# Patient Record
Sex: Female | Born: 2005 | Race: Black or African American | Hispanic: No | Marital: Single
Health system: Southern US, Community
[De-identification: ages and names within clinical notes are randomized; demographics above are authoritative.]

## PROBLEM LIST (undated history)

## (undated) DIAGNOSIS — J45909 Unspecified asthma, uncomplicated: Secondary | ICD-10-CM

## (undated) DIAGNOSIS — F32A Depression, unspecified: Secondary | ICD-10-CM

## (undated) HISTORY — PX: ADENOIDECTOMY W/ MYRINGOTOMY: SHX1128

## (undated) HISTORY — PX: TONSILLECTOMY: SUR1361

---
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
## 2023-01-16 ENCOUNTER — Telehealth (HOSPITAL_COMMUNITY): Payer: Self-pay | Admitting: Psychiatry

## 2023-01-16 ENCOUNTER — Ambulatory Visit (HOSPITAL_COMMUNITY)
Admission: EM | Admit: 2023-01-16 | Discharge: 2023-01-16 | Disposition: A | Discharge status: Home / Self Care | Payer: BC Managed Care – PPO | Attending: Registered Nurse | Admitting: Registered Nurse

## 2023-01-16 ENCOUNTER — Encounter (HOSPITAL_COMMUNITY): Payer: Self-pay | Admitting: Registered Nurse

## 2023-01-16 DIAGNOSIS — F332 Major depressive disorder, recurrent severe without psychotic features: Secondary | ICD-10-CM

## 2023-01-16 DIAGNOSIS — F1210 Cannabis abuse, uncomplicated: Secondary | ICD-10-CM | POA: Diagnosis not present
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
## 2023-01-16 NOTE — Telephone Encounter (Signed)
D:  Mariah Crawford, [name], [facility] referred pt to MH-IOP.  A:  Placed call to orient pt, but there was no answer.  Case manager left number and requested pt to give her a call back.  Inform Mariah and Ava.

## 2023-01-16 NOTE — Discharge Summary (Signed)
Mariah Crawford to be D/C'd Home per NP order. An After Visit Summary was printed and given to the patient by provider. Patient escorted out and D/C home via private auto.  [name]  01/16/2023 5:02 PM

## 2023-03-20 ENCOUNTER — Emergency Department (HOSPITAL_COMMUNITY): Payer: BC Managed Care – PPO

## 2023-03-20 ENCOUNTER — Other Ambulatory Visit: Payer: Self-pay

## 2023-03-20 ENCOUNTER — Emergency Department (HOSPITAL_COMMUNITY)
Admission: EM | Admit: 2023-03-20 | Discharge: 2023-03-20 | Disposition: A | Discharge status: Home / Self Care | Payer: BC Managed Care – PPO | Attending: Emergency Medicine | Admitting: Emergency Medicine

## 2023-03-20 DIAGNOSIS — D649 Anemia, unspecified: Secondary | ICD-10-CM | POA: Insufficient documentation

## 2023-03-20 DIAGNOSIS — M545 Low back pain, unspecified: Secondary | ICD-10-CM | POA: Diagnosis not present

## 2023-03-20 DIAGNOSIS — J45909 Unspecified asthma, uncomplicated: Secondary | ICD-10-CM | POA: Diagnosis not present

## 2023-03-20 DIAGNOSIS — B9689 Other specified bacterial agents as the cause of diseases classified elsewhere: Secondary | ICD-10-CM

## 2023-03-20 DIAGNOSIS — N760 Acute vaginitis: Secondary | ICD-10-CM | POA: Insufficient documentation

## 2023-03-20 DIAGNOSIS — N939 Abnormal uterine and vaginal bleeding, unspecified: Secondary | ICD-10-CM | POA: Diagnosis present

## 2023-03-20 LAB — COMPREHENSIVE METABOLIC PANEL
ALT: 13 U/L (ref 0–44)
AST: 25 U/L (ref 15–41)
Albumin: 3.7 g/dL (ref 3.5–5.0)
Alkaline Phosphatase: 66 U/L (ref 38–126)
Anion gap: 16 — ABNORMAL HIGH (ref 5–15)
BUN: 10 mg/dL (ref 6–20)
CO2: 21 mmol/L — ABNORMAL LOW (ref 22–32)
Calcium: 9.3 mg/dL (ref 8.9–10.3)
Chloride: 101 mmol/L (ref 98–111)
Creatinine, Ser: 0.88 mg/dL (ref 0.44–1.00)
GFR, Estimated: >60 mL/min (ref >60)
Glucose, Bld: 90 mg/dL (ref 70–99)
Potassium: 3.7 mmol/L (ref 3.5–5.1)
Sodium: 138 mmol/L (ref 135–145)
Total Bilirubin: 0.7 mg/dL (ref 0.3–1.2)
Total Protein: 7.3 g/dL (ref 6.5–8.1)

## 2023-03-20 LAB — CBC WITH DIFFERENTIAL/PLATELET
Abs Immature Granulocytes: 0.05 10*3/uL (ref 0.00–0.07)
Basophils Absolute: 0 10*3/uL (ref 0.0–0.1)
Basophils Relative: 0 %
Eosinophils Absolute: 0.1 10*3/uL (ref 0.0–0.5)
Eosinophils Relative: 1 %
HCT: 37 % (ref 36.0–46.0)
Hemoglobin: 11.8 g/dL — ABNORMAL LOW (ref 12.0–15.0)
Immature Granulocytes: 1 %
Lymphocytes Relative: 29 %
Lymphs Abs: 2.7 10*3/uL (ref 0.7–4.0)
MCH: 27.3 pg (ref 26.0–34.0)
MCHC: 31.9 g/dL (ref 30.0–36.0)
MCV: 85.6 fL (ref 80.0–100.0)
Monocytes Absolute: 0.6 10*3/uL (ref 0.1–1.0)
Monocytes Relative: 6 %
Neutro Abs: 5.8 10*3/uL (ref 1.7–7.7)
Neutrophils Relative %: 63 %
Platelets: 272 10*3/uL (ref 150–400)
RBC: 4.32 MIL/uL (ref 3.87–5.11)
RDW: 13 % (ref 11.5–15.5)
WBC: 9.2 10*3/uL (ref 4.0–10.5)
nRBC: 0 % (ref 0.0–0.2)

## 2023-03-20 LAB — URINALYSIS, ROUTINE W REFLEX MICROSCOPIC
Bilirubin Urine: NEGATIVE
Glucose, UA: NEGATIVE mg/dL
Ketones, ur: NEGATIVE mg/dL
Nitrite: NEGATIVE
Protein, ur: NEGATIVE mg/dL
Specific Gravity, Urine: 1.015 (ref 1.005–1.030)
pH: 8 (ref 5.0–8.0)

## 2023-03-20 LAB — WET PREP, GENITAL
Sperm: NONE SEEN
Trich, Wet Prep: NONE SEEN
WBC, Wet Prep HPF POC: <10 (ref <10)
Yeast Wet Prep HPF POC: NONE SEEN

## 2023-03-20 LAB — I-STAT BETA HCG BLOOD, ED (MC, WL, AP ONLY): I-stat hCG, quantitative: <5 m[IU]/mL (ref <5)

## 2023-03-20 LAB — URINALYSIS, MICROSCOPIC (REFLEX): WBC, UA: >50 WBC/hpf (ref 0–5)

## 2023-03-20 LAB — LIPASE, BLOOD: Lipase: 36 U/L (ref 11–51)

## 2023-03-20 MED ORDER — METRONIDAZOLE 500 MG PO TABS: 500.0000 mg | ORAL_TABLET | Freq: Two times a day (BID) | ORAL | 0 refills | Status: DC

## 2023-03-20 MED ORDER — METRONIDAZOLE 500 MG PO TABS
500.0000 mg | ORAL_TABLET | Freq: Once | ORAL | Status: AC
  Administered 2023-03-20: 500 mg via ORAL
  Filled 2023-03-20: qty 1

## 2023-03-20 NOTE — ED Provider Notes (Signed)
[place] EMERGENCY DEPARTMENT AT [facility] Provider Note   CSN: [id number] Arrival date & time: 03/20/23  4098     History  Chief Complaint  Patient presents with   Vaginal Bleeding    Mariah Crawford is a 17 y.o. female with a past medical history significant for asthma who presents to the ED due to intermittent vaginal bleeding x 1 week associated with "2 balls" in her vagina.  Patient also endorses low back pain.  Symptoms started after a heavy menstrual cycle 1 week ago.  She noticed small balls in her vagina a few days ago.  Did not use tampons during last menstrual cycle.  Notes her menstrual cycle was heavier and longer than normal.  No history of heavy menses.  Also endorses increased vaginal discharge and malodor.  Patient is currently sexually active with 1 partner.  Denies concerns for STIs.  Denies any urinary symptoms.  Patient is followed by an OB/GYN with [facility]. Currently on oral BC. No concern for pregnancy.   History obtained from patient and past medical records. No interpreter used during encounter.       Home Medications Prior to Admission medications   Medication Sig Start Date End Date Taking? Authorizing Provider  metroNIDAZOLE (FLAGYL) 500 MG tablet Take 1 tablet (500 mg total) by mouth 2 (two) times daily. 03/20/23  Yes Sanvi Ehler, Merla Riches, PA-C  FLUoxetine (PROZAC) 10 MG capsule Take 3 capsules (30 mg total) by mouth daily. 11/28/21   Leata Mouse, MD  hydrOXYzine (ATARAX) 50 MG tablet Take 1 tablet (50 mg total) by mouth at bedtime. 11/27/21   Leata Mouse, MD  linaclotide Karlene Einstein) 145 MCG CAPS capsule Take 1 capsule (145 mcg total) by mouth daily before breakfast. 11/28/21   Leata Mouse, MD  nicotine (NICODERM CQ - DOSED IN MG/24 HR) 7 mg/24hr patch Place 1 patch (7 mg total) onto the skin daily. 11/28/21   Leata Mouse, MD  traZODone (DESYREL) 50 MG tablet Take 1 tablet (50 mg total) by  mouth at bedtime as needed for sleep. 11/27/21   Leata Mouse, MD      Allergies    Lactose intolerance (gi)    Review of Systems   Review of Systems  Constitutional:  Negative for chills and fever.  Respiratory:  Negative for shortness of breath.   Cardiovascular:  Negative for chest pain.  Gastrointestinal:  Negative for abdominal pain.  Genitourinary:  Positive for vaginal discharge. Negative for dysuria.  Musculoskeletal:  Positive for back pain.    Physical Exam Updated Vital Signs BP 112/79   Pulse 84   Temp 98.4 F (36.9 C) (Oral)   Resp 16   LMP 03/09/2023 (Exact Date)   SpO2 100%  Physical Exam Vitals and nursing note reviewed.  Constitutional:      General: She is not in acute distress.    Appearance: She is not ill-appearing.  HENT:     Head: Normocephalic.  Eyes:     Pupils: Pupils are equal, round, and reactive to light.  Cardiovascular:     Rate and Rhythm: Normal rate and regular rhythm.     Pulses: Normal pulses.     Heart sounds: Normal heart sounds. No murmur heard.    No friction rub. No gallop.  Pulmonary:     Effort: Pulmonary effort is normal.     Breath sounds: Normal breath sounds.  Abdominal:     General: Abdomen is flat. There is no distension.  Palpations: Abdomen is soft.     Tenderness: There is no abdominal tenderness. There is no guarding or rebound.  Musculoskeletal:        General: Normal range of motion.     Cervical back: Neck supple.  Skin:    General: Skin is warm and dry.  Neurological:     General: No focal deficit present.     Mental Status: She is alert.  Psychiatric:        Mood and Affect: Mood normal.        Behavior: Behavior normal.     ED Results / Procedures / Treatments   Labs (all labs ordered are listed, but only abnormal results are displayed) Labs Reviewed  WET PREP, GENITAL - Abnormal; Notable for the following components:      Result Value   Clue Cells Wet Prep HPF POC PRESENT (*)     All other components within normal limits  CBC WITH DIFFERENTIAL/PLATELET - Abnormal; Notable for the following components:   Hemoglobin 11.8 (*)    All other components within normal limits  COMPREHENSIVE METABOLIC PANEL - Abnormal; Notable for the following components:   CO2 21 (*)    Anion gap 16 (*)    All other components within normal limits  URINALYSIS, ROUTINE W REFLEX MICROSCOPIC - Abnormal; Notable for the following components:   APPearance CLOUDY (*)    Hgb urine dipstick SMALL (*)    Leukocytes,Ua LARGE (*)    All other components within normal limits  URINALYSIS, MICROSCOPIC (REFLEX) - Abnormal; Notable for the following components:   Bacteria, UA MANY (*)    All other components within normal limits  URINE CULTURE  LIPASE, BLOOD  I-STAT BETA HCG BLOOD, ED ([facility], [facility], [facility] ONLY)  GC/CHLAMYDIA PROBE AMP ([place]) NOT AT [facility]    EKG None  Radiology US Pelvis Complete  Result Date: 03/20/2023 CLINICAL DATA:  One-week history of vaginal bleeding and pelvic/lower back pain EXAM: TRANSABDOMINAL AND TRANSVAGINAL ULTRASOUND OF PELVIS DOPPLER ULTRASOUND OF OVARIES TECHNIQUE: Both transabdominal and transvaginal ultrasound examinations of the pelvis were performed. Transabdominal technique was performed for global imaging of the pelvis including uterus, ovaries, adnexal regions, and pelvic cul-de-sac. It was necessary to proceed with endovaginal exam following the transabdominal exam to visualize the pelvic structures. Color and duplex Doppler ultrasound was utilized to evaluate blood flow to the ovaries. COMPARISON:  Ultrasound pelvis dated 07/10/2017 FINDINGS: Uterus Measurements: 6.1 cm in sagittal dimension. No fibroids or other mass visualized. Endometrium Thickness: 1 mm.  No focal abnormality visualized. Right ovary Measurements: 3.5 x 2.1 x 1.3 cm = volume: 5.2 mL. Normal appearance. No adnexal mass. Left ovary Measurements: 3.0 x 2.1 x 2.0 cm = volume: 6.6 mL. Normal  appearance. No adnexal mass. Pulsed Doppler evaluation of both ovaries demonstrates normal low-resistance arterial and venous waveforms. Other findings No abnormal free fluid. IMPRESSION: Normal pelvic ultrasound examination. No evidence of ovarian torsion. Electronically Signed   By: Agustin Cree M.D.   On: 03/20/2023 10:41   US Transvaginal Non-OB  Result Date: 03/20/2023 CLINICAL DATA:  One-week history of vaginal bleeding and pelvic/lower back pain EXAM: TRANSABDOMINAL AND TRANSVAGINAL ULTRASOUND OF PELVIS DOPPLER ULTRASOUND OF OVARIES TECHNIQUE: Both transabdominal and transvaginal ultrasound examinations of the pelvis were performed. Transabdominal technique was performed for global imaging of the pelvis including uterus, ovaries, adnexal regions, and pelvic cul-de-sac. It was necessary to proceed with endovaginal exam following the transabdominal exam to visualize the pelvic structures. Color and duplex Doppler  ultrasound was utilized to evaluate blood flow to the ovaries. COMPARISON:  Ultrasound pelvis dated 07/10/2017 FINDINGS: Uterus Measurements: 6.1 cm in sagittal dimension. No fibroids or other mass visualized. Endometrium Thickness: 1 mm.  No focal abnormality visualized. Right ovary Measurements: 3.5 x 2.1 x 1.3 cm = volume: 5.2 mL. Normal appearance. No adnexal mass. Left ovary Measurements: 3.0 x 2.1 x 2.0 cm = volume: 6.6 mL. Normal appearance. No adnexal mass. Pulsed Doppler evaluation of both ovaries demonstrates normal low-resistance arterial and venous waveforms. Other findings No abnormal free fluid. IMPRESSION: Normal pelvic ultrasound examination. No evidence of ovarian torsion. Electronically Signed   By: Agustin Cree M.D.   On: 03/20/2023 10:41   [place] Art/Ven Flow Abd Pelv Doppler  Result Date: 03/20/2023 CLINICAL DATA:  One-week history of vaginal bleeding and pelvic/lower back pain EXAM: TRANSABDOMINAL AND TRANSVAGINAL ULTRASOUND OF PELVIS DOPPLER ULTRASOUND OF OVARIES TECHNIQUE: Both  transabdominal and transvaginal ultrasound examinations of the pelvis were performed. Transabdominal technique was performed for global imaging of the pelvis including uterus, ovaries, adnexal regions, and pelvic cul-de-sac. It was necessary to proceed with endovaginal exam following the transabdominal exam to visualize the pelvic structures. Color and duplex Doppler ultrasound was utilized to evaluate blood flow to the ovaries. COMPARISON:  Ultrasound pelvis dated 07/10/2017 FINDINGS: Uterus Measurements: 6.1 cm in sagittal dimension. No fibroids or other mass visualized. Endometrium Thickness: 1 mm.  No focal abnormality visualized. Right ovary Measurements: 3.5 x 2.1 x 1.3 cm = volume: 5.2 mL. Normal appearance. No adnexal mass. Left ovary Measurements: 3.0 x 2.1 x 2.0 cm = volume: 6.6 mL. Normal appearance. No adnexal mass. Pulsed Doppler evaluation of both ovaries demonstrates normal low-resistance arterial and venous waveforms. Other findings No abnormal free fluid. IMPRESSION: Normal pelvic ultrasound examination. No evidence of ovarian torsion. Electronically Signed   By: Agustin Cree M.D.   On: 03/20/2023 10:41    Procedures Procedures    Medications Ordered in ED Medications  metroNIDAZOLE (FLAGYL) tablet 500 mg (has no administration in time range)    ED Course/ Medical Decision Making/ A&P Clinical Course as of 03/20/23 1107  Wed Mar 20, 2023  1027 Clue Cells Wet Prep HPF POC(!): PRESENT [CA]  1027 I-stat hCG, quantitative: <5.0 [CA]    Clinical Course User Index [CA] Mannie Stabile, PA-C                             Medical Decision Making Amount and/or Complexity of Data Reviewed Independent Historian: parent Labs: ordered. Decision-making details documented in ED Course. Radiology: ordered and independent interpretation performed. Decision-making details documented in ED Course.  Risk Prescription drug management.   This patient presents to the ED for concern of  vaginal discharge and "balls in vagina", this involves an extensive number of treatment options, and is a complaint that carries with it a high risk of complications and morbidity.  The differential diagnosis includes BV, yeast, STI, fibroid, herpes, etc  17 year old female presents to the ED due to intermittent vaginal spotting x 1 week associated with "balls" in her vagina.  Patient had a heavy menstrual cycle 1 week ago and felt balls in her vagina since then.  Patient is currently sexually active with 1 partner.  No concern for STIs.  Admits to increased vaginal discharge and malodor.  No history of heavy menses.  Upon arrival, vitals all within normal limits.  Patient in no acute distress.  Reassuring physical exam.  Abdomen soft, nondistended, nontender. Low suspicion for acute abdomen. Pelvic exam performed with chaperone in room.  Did not appreciate any "balls" on exam.  Wet prep and gonorrhea/chlamydia cultures obtained.  No CMT.  No adnexal masses or tenderness.  Will obtain pelvic ultrasound to further investigate. Labs ordered.  CBC reassuring.  No leukocytosis.  Mild anemia with hemoglobin 11.8.  Lipase normal.  CMP reassuring.  Normal renal function.  No major electrolyte derangements.  Slight anion gap of unknown etiology.  UA significant for large leukocytes and many bacteria.  Urine does not appear contaminated.  Patient denies any dysuria or signs of UTI.  Will hold on antibiotic at this time.  Urine culture pending.  Wet prep positive for clue cells which is likely causing the increased vaginal discharge and malodor.  Pelvic ultrasound negative for any acute abnormalities.  No evidence of ovarian torsion.  No evidence of fibroids.  Unknown etiology of "balls" in vagina.  Advised patient to follow-up with her OB/GYN for further evaluation.  Patient discharged with Flagyl for BV.  Advised patient not to drink alcohol while on the medication. Strict ED precautions discussed with patient. Patient  states understanding and agrees to plan. Patient discharged home in no acute distress and stable vitals  Lives at home Hx asthma       Final Clinical Impression(s) / ED Diagnoses Final diagnoses:  BV (bacterial vaginosis)    Rx / DC Orders ED Discharge Orders          Ordered    metroNIDAZOLE (FLAGYL) 500 MG tablet  2 times daily        03/20/23 1103              Mannie Stabile, [place] 03/20/23 1107    Tegeler, Canary Brim, MD 03/20/23 1558

## 2023-03-20 NOTE — Discharge Instructions (Addendum)
It was a pleasure taking care of you today.  As discussed, your ultrasound did not show any abnormalities.  Your wet prep was positive for BV.  I am sending you home with an antibiotic.  Take as prescribed and finish all antibiotics.  Do not drink alcohol while on the antibiotic.  Please call your OB/GYN today for further evaluation.  Return to the ER for any worsening symptoms.

## 2023-03-20 NOTE — ED Notes (Signed)
Patient transported to Ultrasound 

## 2023-03-20 NOTE — ED Triage Notes (Signed)
Patient reports one week of vaginal bleeding after having a heavier normal cycle starting on 6/1 and "feeling like I have to small balls in my vagina." Also reports lower back pain.

## 2023-03-21 LAB — URINE CULTURE

## 2023-03-21 LAB — GC/CHLAMYDIA PROBE AMP (~~LOC~~) NOT AT ARMC
Chlamydia: NEGATIVE
Comment: NEGATIVE
Comment: NORMAL
Neisseria Gonorrhea: NEGATIVE

## 2023-03-22 LAB — URINE CULTURE: Culture: >=100000 — AB

## 2023-03-23 ENCOUNTER — Telehealth (HOSPITAL_BASED_OUTPATIENT_CLINIC_OR_DEPARTMENT_OTHER): Payer: Self-pay

## 2023-03-23 NOTE — Telephone Encounter (Signed)
Post ED Visit - Positive Culture Follow-up  Culture report reviewed by antimicrobial stewardship pharmacist: [facility] [x]  Estill Batten, Pharm.D. BCCCP []  Celedonio Miyamoto, Pharm.D., BCPS AQ-ID []  Garvin Fila, Pharm.D., BCPS []  Georgina Pillion, Pharm.D., BCPS []  [place], [place].D., BCPS, AAHIVP []  Estella Husk, Pharm.D., BCPS, AAHIVP []  Lysle Pearl, PharmD, BCPS []  Phillips Climes, PharmD, BCPS []  Agapito Games, PharmD, BCPS []  Verlan Friends, PharmD []  Mervyn Gay, PharmD, BCPS []  Vinnie Level, PharmD  [facility] []  Len Childs, PharmD []  Greer Pickerel, PharmD []  Adalberto Cole, PharmD []  Perlie Gold, Rph []  Lonell Face) Jean Rosenthal, PharmD []  Earl Many, PharmD []  Junita Push, PharmD []  Dorna Leitz, PharmD []  Terrilee Files, PharmD []  Lynann Beaver, PharmD []  Keturah Barre, PharmD []  Loralee Pacas, PharmD []  Bernadene Person, PharmD   Positive urine culture Treated with Metrondazole, organism sensitive to the same and no further patient follow-up is required at this time.  Sandria Senter 03/23/2023, 1:20 PM

## 2024-05-03 ENCOUNTER — Emergency Department (HOSPITAL_BASED_OUTPATIENT_CLINIC_OR_DEPARTMENT_OTHER)

## 2024-05-03 ENCOUNTER — Emergency Department (HOSPITAL_BASED_OUTPATIENT_CLINIC_OR_DEPARTMENT_OTHER): Admission: EM | Admit: 2024-05-03 | Discharge: 2024-05-03 | Disposition: A | Discharge status: Home / Self Care

## 2024-05-03 ENCOUNTER — Other Ambulatory Visit: Payer: Self-pay

## 2024-05-03 ENCOUNTER — Encounter (HOSPITAL_BASED_OUTPATIENT_CLINIC_OR_DEPARTMENT_OTHER): Payer: Self-pay | Admitting: Emergency Medicine

## 2024-05-03 DIAGNOSIS — Z3A13 13 weeks gestation of pregnancy: Secondary | ICD-10-CM | POA: Diagnosis not present

## 2024-05-03 DIAGNOSIS — O26891 Other specified pregnancy related conditions, first trimester: Secondary | ICD-10-CM | POA: Insufficient documentation

## 2024-05-03 DIAGNOSIS — R1030 Lower abdominal pain, unspecified: Secondary | ICD-10-CM | POA: Diagnosis not present

## 2024-05-03 DIAGNOSIS — R519 Headache, unspecified: Secondary | ICD-10-CM | POA: Insufficient documentation

## 2024-05-03 DIAGNOSIS — Z3490 Encounter for supervision of normal pregnancy, unspecified, unspecified trimester: Secondary | ICD-10-CM

## 2024-05-03 LAB — URINALYSIS, ROUTINE W REFLEX MICROSCOPIC
Bacteria, UA: NONE SEEN
Bilirubin Urine: NEGATIVE
Glucose, UA: NEGATIVE mg/dL
Hgb urine dipstick: NEGATIVE
Ketones, ur: NEGATIVE mg/dL
Leukocytes,Ua: NEGATIVE
Nitrite: NEGATIVE
Protein, ur: NEGATIVE mg/dL
Specific Gravity, Urine: 1.025 (ref 1.005–1.030)
pH: 7.5 (ref 5.0–8.0)

## 2024-05-03 MED ORDER — ACETAMINOPHEN 325 MG PO TABS
975.0000 mg | ORAL_TABLET | Freq: Once | ORAL | Status: AC
  Administered 2024-05-03: 975 mg via ORAL
  Filled 2024-05-03: qty 3

## 2024-05-03 MED ORDER — DIPHENHYDRAMINE HCL 25 MG PO CAPS
50.0000 mg | ORAL_CAPSULE | Freq: Once | ORAL | Status: AC
  Administered 2024-05-03: 50 mg via ORAL
  Filled 2024-05-03: qty 2

## 2024-05-03 NOTE — Discharge Instructions (Signed)
 It does appear consistent for intrauterine pregnancy.  Heart rate is normal for the fetus.  Please follow-up with your OB.  If you have any kind of bleeding please follow-up with the OB and/or come to see us   Your headache improved a lot with Tylenol  and Benadryl .  Given your history of migraines in the past, seems to be consistent for this.  You state that your headache resolved completely with Tylenol  and Benadryl  here in the ED.  Take Tylenol  going forward for any kind of headaches.   If you notice any kind of vision changes, double vision, spotty vision, or any kind of numbness or tingling or worsening headache then please come to the ED for further evaluation

## 2024-05-03 NOTE — ED Triage Notes (Signed)
 C/o headache and lower abd pain/cramping since last night. 3 months pregnant. Follow by OB. Denies bleeding.

## 2024-05-03 NOTE — ED Provider Notes (Signed)
 [place] EMERGENCY DEPARTMENT AT [facility] Provider Note   CSN: [id number] Arrival date & time: 05/03/24  1037     Patient presents with: Headache and Abdominal Pain   Mariah Crawford is a 18 y.o. female.    Headache Associated symptoms: abdominal pain   Abdominal Pain   Patient presents with pelvic cramping.  No vaginal discharge.  No vaginal bleeding.  Patient is feels like she is having some sharp cramps in the pelvic area.  Known to be [redacted] weeks pregnant.  Has had ultrasound before that showed intrauterine pregnancy.  No chest pain or shortness of breath.  No nausea vomit diarrhea.  No dysuria.   Patient also endorsing a mild headache.  Been present over the past day.  Took Tylenol  this morning without relief.  Patient states she does have a history of migraines.  Not as bad as her previous migraines but just a generalized headache.  No vision changes.  No numbness or tingling anywhere.     Prior to Admission medications   Medication Sig Start Date End Date Taking? Authorizing Provider  FLUoxetine  (PROZAC ) 10 MG capsule Take 3 capsules (30 mg total) by mouth daily. 11/28/21   Jonnalagadda, Janardhana, MD  hydrOXYzine  (ATARAX ) 50 MG tablet Take 1 tablet (50 mg total) by mouth at bedtime. 11/27/21   Jonnalagadda, Janardhana, MD  linaclotide  (LINZESS ) 145 MCG CAPS capsule Take 1 capsule (145 mcg total) by mouth daily before breakfast. 11/28/21   Jonnalagadda, Janardhana, MD  metroNIDAZOLE  (FLAGYL ) 500 MG tablet Take 1 tablet (500 mg total) by mouth 2 (two) times daily. 03/20/23   Lorelle Aleck BROCKS, PA-C  nicotine  (NICODERM CQ  - DOSED IN MG/24 HR) 7 mg/24hr patch Place 1 patch (7 mg total) onto the skin daily. 11/28/21   Jonnalagadda, Janardhana, MD  traZODone  (DESYREL ) 50 MG tablet Take 1 tablet (50 mg total) by mouth at bedtime as needed for sleep. 11/27/21   Jonnalagadda, Janardhana, MD    Allergies: Lactose intolerance (gi)    Review of Systems  Gastrointestinal:   Positive for abdominal pain.  Neurological:  Positive for headaches.    Updated Vital Signs BP 120/85 (BP Location: Right Arm)   Pulse 77   Temp 98.7 F (37.1 C) (Oral)   Resp 16   SpO2 100%   Physical Exam Vitals and nursing note reviewed.  Constitutional:      General: She is not in acute distress.    Appearance: She is well-developed.  HENT:     Head: Normocephalic and atraumatic.  Eyes:     Conjunctiva/sclera: Conjunctivae normal.  Cardiovascular:     Rate and Rhythm: Normal rate and regular rhythm.     Heart sounds: No murmur heard. Pulmonary:     Effort: Pulmonary effort is normal. No respiratory distress.     Breath sounds: Normal breath sounds.  Abdominal:     Palpations: Abdomen is soft.     Tenderness: There is no abdominal tenderness.  Musculoskeletal:        General: No swelling.     Cervical back: Neck supple.  Skin:    General: Skin is warm and dry.     Capillary Refill: Capillary refill takes less than 2 seconds.  Neurological:     Mental Status: She is alert.  Psychiatric:        Mood and Affect: Mood normal.     (all labs ordered are listed, but only abnormal results are displayed) Labs Reviewed  URINALYSIS, ROUTINE W REFLEX MICROSCOPIC -  Abnormal; Notable for the following components:      Result Value   APPearance HAZY (*)    All other components within normal limits    EKG: None  Radiology: US  OB Comp < 14 Wks Result Date: 05/03/2024 CLINICAL DATA:  Pelvic pain and cramping in 1st trimester pregnancy. EXAM: OBSTETRIC <14 WK ULTRASOUND TECHNIQUE: Transabdominal ultrasound was performed for evaluation of the gestation as well as the maternal uterus and adnexal regions. COMPARISON:  None Available. FINDINGS: Intrauterine gestational sac: Single Yolk sac:  Not Visualized. Embryo:  Visualized. Cardiac Activity: Visualized. Heart Rate: 166 bpm CRL:   60 mm   12 w 3 d                  US  EDC: 11/12/2024 Subchorionic hemorrhage:  None visualized.  Maternal uterus/adnexae: Normal appearance of left ovary. Right ovary not directly visualized, however, no adnexal mass or abnormal free fluid identified. IMPRESSION: Single living IUP with estimated gestational age of [redacted] weeks 3 days. No maternal uterine or adnexal abnormality identified. Electronically Signed   By: Norleen DELENA Kil M.D.   On: 05/03/2024 13:00     Ultrasound ED OB Pelvic  Date/Time: 05/03/2024 1:43 PM  Performed by: Simon Lavonia SAILOR, MD Authorized by: Simon Lavonia SAILOR, MD   Procedure details:    Indications: pregnant with abdominal pain     Assess:  Intrauterine pregnancy   Technique:  Transabdominal obstetric (HCG+) exam   Images: archived    Uterine findings:    Intrauterine pregnancy: identified     Single gestation: identified     Fetal heart rate: identified         Medications Ordered in the ED  diphenhydrAMINE  (BENADRYL ) capsule 50 mg (50 mg Oral Given 05/03/24 1250)  acetaminophen  (TYLENOL ) tablet 975 mg (975 mg Oral Given 05/03/24 1250)                                      Medical Decision Making Amount and/or Complexity of Data Reviewed Labs: ordered. Radiology: ordered.  Risk OTC drugs.   Patient presents with pelvic cramping.  No vaginal discharge.  No vaginal bleeding.  Patient is feels like she is having some sharp cramps in the pelvic area.  Known to be [redacted] weeks pregnant.  Has had ultrasound before that showed intrauterine pregnancy.  No chest pain or shortness of breath.  No nausea vomit diarrhea.  No dysuria.   Patient also endorsing a mild headache.  Been present over the past day.  Took Tylenol  this morning without relief.  Patient states she does have a history of migraines.  Not as bad as her previous migraines but just a generalized headache.  No vision changes.  No numbness or tingling anywhere.   In terms of headache, patient ANO x 3 GCS 15.  NIH is 0.  No diplopia.  No vision changes.  No signs of meningismus.  Negative Kernig's and  Brudzinski.  No pain with flexion of the head.  No concerns for any, dural venous sinus thrombosis at this point time given that headache has no neurofindings as well as headache completely resolved with both Tylenol  and Benadryl .  Could be migrainous in nature given history of migraines.  No concerns encountered other intracranial pathology.  No indication for imaging.  Patient is [redacted] weeks pregnant.  Normotensive.  No concern for preeclampsia based off of gestational age as well  as vital signs.   In terms of some pelvic cramping.  No bleeding.  No dysuria or hematuria.  Did obtain UA.  Unremarkable.  No asymptomatic bacteria.  Indication to start antibiotics.  No vaginal discharge.  Did perform transabdominal ultrasound.  Showed intrauterine pregnancy.  Also got an official ultrasound as well which showed normal intrauterine pregnancy.  No acute complications.  No pain to palpation of the right lower quadrant to be concerning for appendicitis.  No pain to palpation of CVA angle to be concerning for nephrolithiasis.   Given patient symptoms improved.  Discharged in stable condition. Follow up with OB     Final diagnoses:  Lower abdominal pain  Nonintractable headache, unspecified chronicity pattern, unspecified headache type  Intrauterine pregnancy    ED Discharge Orders     None          Simon Lavonia SAILOR, MD 05/03/24 1347

## 2024-05-13 LAB — OB RESULTS CONSOLE HIV ANTIBODY (ROUTINE TESTING): HIV: NONREACTIVE

## 2024-05-13 LAB — OB RESULTS CONSOLE RPR: RPR: NONREACTIVE

## 2024-05-13 LAB — HEPATITIS C ANTIBODY: HCV Ab: NEGATIVE

## 2024-05-13 LAB — OB RESULTS CONSOLE RUBELLA ANTIBODY, IGM: Rubella: IMMUNE

## 2024-10-08 NOTE — L&D Delivery Note (Signed)
 Delivery Note    Patient Name: Mariah Crawford DOB: 05/21/2006 MRN: [id number]  Date of admission: 11/11/2024 Delivering MD: Sophiamarie Nease , Chimaobi Casebolt Date of delivery: 11/11/2024 Type of delivery: SVD  Newborn Data: Live born female  Birth Weight:   APGAR: 9, 9  Newborn Delivery   Birth date/time: 11/11/2024 17:04:00 Delivery type: Vaginal, Spontaneous       [name], 18 y.o., @ [redacted]w[redacted]d,  G1P0, who was admitted for IOL for NRFHT. GBS+. I was called to the room when she progressed 2+ station in the second stage of labor.  She pushed for 86minshours/min.  She delivered a viable infant, cephalic and restituted to the ROA position over an intact perineum.  A nuchal cord   was identified loose and sumersaulted also had loose body cord . The baby was placed on maternal abdomen while initial step of NRP were perfmored (Dry, Stimulated, and warmed). Hat placed on baby for thermoregulation. Delayed cord clamping was performed for 2 minutes.  Cord double clamped and cut.  Cord cut by FOB. Apgar scores were 9 and 9. Prophylactic Pitocin  was started in the third stage of labor for active management. The placenta delivered spontaneously, shultz, with a 3 vessel cord and was sent to LD.  Inspection revealed 2nd degree. An examination of the vaginal vault and cervix was free from lacerations. The uterus was firm, bleeding stable.  The repair was done under epidural.   Placenta and umbilical artery blood gas were not sent.  There were no complications during the procedure.  Mom and baby skin to skin following delivery. Left in stable condition.  Maternal Info: Anesthesia:Epidural Episiotomy: no Lacerations:  2nd Suture Repair: 3.0 vicryl CT-1 Est. Blood Loss (mL):   Newborn Info:  Baby Sex: female Circumcision: in pt desired Babies Name: Zion APGAR (1 MIN): 9  APGAR (5 MINS): 9  APGAR (10 MINS):     Mom to postpartum.  Baby to Couplet care / Skin to Skin.  Delivery Report:    Review the Delivery  Report for details.   Tanicka Bisaillon  CNM, FNP-C, PMHNP-BC  [address]  [place], [place] [address]  Cell: [phone]  Office Phone: [phone] Fax: [phone] 11/11/2024  5:25 PM

## 2024-10-15 LAB — OB RESULTS CONSOLE GBS: GBS: POSITIVE

## 2024-11-09 ENCOUNTER — Encounter (HOSPITAL_COMMUNITY): Payer: Self-pay

## 2024-11-09 ENCOUNTER — Other Ambulatory Visit: Payer: Self-pay

## 2024-11-09 ENCOUNTER — Inpatient Hospital Stay (HOSPITAL_COMMUNITY)
Admission: AD | Admit: 2024-11-09 | Discharge: 2024-11-09 | Disposition: A | Discharge status: Home / Self Care | Source: Home / Self Care | Attending: Obstetrics and Gynecology | Admitting: Obstetrics and Gynecology

## 2024-11-09 DIAGNOSIS — O471 False labor at or after 37 completed weeks of gestation: Secondary | ICD-10-CM | POA: Diagnosis not present

## 2024-11-09 DIAGNOSIS — Z3A40 40 weeks gestation of pregnancy: Secondary | ICD-10-CM

## 2024-11-09 DIAGNOSIS — Z3689 Encounter for other specified antenatal screening: Secondary | ICD-10-CM

## 2024-11-09 HISTORY — DX: Depression, unspecified: F32.A

## 2024-11-09 NOTE — MAU Note (Signed)
 MAU Labor Evaluation RN Medical Screening Exam: RN Assessment:  .Mariah Crawford is a 19 y.o. at [redacted]w[redacted]d here in MAU for evaluation of labor. See RN triage note.   Pain Score: 6  Pain Location: Abdomen  Cervical exam:  Dilation: 1 Effacement (%): 50 Station: -3 Exam by:: Nat Mule, RN  Fetal Monitoring: Baseline Rate (A): 150 bpm Variability: Moderate Accelerations: 15 x 15 Decelerations: None Contraction Frequency (min): 3-5  Vitals:   11/09/24 1116 11/09/24 1143  BP: 112/84 109/78  Pulse: (!) 106 84  Resp: 18   Temp: 98.2 F (36.8 C)   SpO2: 100%       Medical Decision Making & Provider Communication:  This RN has communicated with: Provider Name/Title: L. Cooleen, NP  Communicated with MAU provider SBAR report of labor evaluation. Also reviewed contraction pattern and that non-stress test is reactive.  Contraction Frequency (min): 3-5 has been documented with minimal cervical change since last office visit not indicating active labor.  Patient denies any other complaints.  Based on this report provider has given order for discharge. A discharge order and diagnosis entered by a provider. Labor discharge precautions reviewed with patient and all questions answered. Patient verbalized understanding.   Plan of Care: Discharge home with strict labor precautions

## 2024-11-09 NOTE — MAU Note (Signed)
 MAU Labor Triage Note: .Mariah Crawford is a 19 y.o. at [redacted]w[redacted]d here in MAU reporting:  Contractions every: 11 minutes Onset of ctx: 0800 Pain Score: 6  Pain Location: Abdomen  ROM: denies Vaginal Bleeding: denies Last SVE: closed Labor Pain Management Plan: Planning epidural, IV pain medication, and Nitrous Oxide  GBS: Negative  Fetal Movement: Reports positive FM FHT: Fetal Heart Rate Mode: External Baseline Rate (A): 145 bpm  Vitals:   11/09/24 1116  BP: 112/84  Pulse: (!) 106  Resp: 18  Temp: 98.2 F (36.8 C)  SpO2: 100%      Lab orders placed from triage: MAU Labor Eval OB Office: [facility]

## 2024-11-09 NOTE — MAU Provider Note (Signed)
 Mariah Crawford is a 19 y.o. G1P0 female at [redacted]w[redacted]d  RN Labor check, not seen by provider SVE by RN: Dilation: 1 Effacement (%): 50 Station: -3 Exam by:: Nat Mule, RN NST: FHR baseline 145-150 bpm, Variability: moderate, Accelerations:present, Decelerations:  Absent= Cat 1/Reactive Toco: irregular   D/C home  [name] CNM, WHNP-BC 11/09/2024 11:41 AM

## 2024-11-11 ENCOUNTER — Inpatient Hospital Stay (HOSPITAL_COMMUNITY): Admitting: Anesthesiology

## 2024-11-11 ENCOUNTER — Encounter (HOSPITAL_COMMUNITY): Payer: Self-pay | Admitting: Obstetrics and Gynecology

## 2024-11-11 ENCOUNTER — Inpatient Hospital Stay (HOSPITAL_COMMUNITY)
Admit: 2024-11-11 | Discharge: 2024-11-13 | Disposition: A | Payer: Self-pay | Attending: Obstetrics and Gynecology | Admitting: Obstetrics and Gynecology

## 2024-11-11 DIAGNOSIS — O480 Post-term pregnancy: Principal | ICD-10-CM | POA: Diagnosis present

## 2024-11-11 DIAGNOSIS — D62 Acute posthemorrhagic anemia: Secondary | ICD-10-CM | POA: Diagnosis not present

## 2024-11-11 DIAGNOSIS — K589 Irritable bowel syndrome without diarrhea: Secondary | ICD-10-CM | POA: Diagnosis present

## 2024-11-11 LAB — CBC
HCT: 32.2 % — ABNORMAL LOW (ref 36.0–46.0)
Hemoglobin: 10 g/dL — ABNORMAL LOW (ref 12.0–15.0)
MCH: 24.7 pg — ABNORMAL LOW (ref 26.0–34.0)
MCHC: 31.1 g/dL (ref 30.0–36.0)
MCV: 79.5 fL — ABNORMAL LOW (ref 80.0–100.0)
Platelets: 194 10*3/uL (ref 150–400)
RBC: 4.05 MIL/uL (ref 3.87–5.11)
RDW: 15.1 % (ref 11.5–15.5)
WBC: 10.1 10*3/uL (ref 4.0–10.5)
nRBC: 0 % (ref 0.0–0.2)

## 2024-11-11 LAB — TYPE AND SCREEN
ABO/RH(D): O NEG
Antibody Screen: NEGATIVE

## 2024-11-11 LAB — OB RESULTS CONSOLE GC/CHLAMYDIA
Chlamydia: NEGATIVE
Neisseria Gonorrhea: NEGATIVE

## 2024-11-11 LAB — SYPHILIS: RPR W/REFLEX TO RPR TITER AND TREPONEMAL ANTIBODIES, TRADITIONAL SCREENING AND DIAGNOSIS ALGORITHM: RPR Ser Ql: NONREACTIVE

## 2024-11-11 MED ORDER — OXYTOCIN BOLUS FROM INFUSION
333.0000 mL | Freq: Once | INTRAVENOUS | Status: AC
  Administered 2024-11-11: 333 mL via INTRAVENOUS

## 2024-11-11 MED ORDER — LIDOCAINE HCL (PF) 1 % IJ SOLN
INTRAMUSCULAR | Status: DC | PRN
  Administered 2024-11-11: 2 mL via EPIDURAL
  Administered 2024-11-11: 3 mL via EPIDURAL

## 2024-11-11 MED ORDER — VENLAFAXINE HCL ER 75 MG PO CP24
75.0000 mg | ORAL_CAPSULE | Freq: Every day | ORAL | Status: DC
  Administered 2024-11-12 – 2024-11-13 (×2): 75 mg via ORAL
  Filled 2024-11-11 (×3): qty 1

## 2024-11-11 MED ORDER — IBUPROFEN 600 MG PO TABS
600.0000 mg | ORAL_TABLET | Freq: Four times a day (QID) | ORAL | Status: DC
  Administered 2024-11-11 – 2024-11-13 (×8): 600 mg via ORAL
  Filled 2024-11-11 (×8): qty 1

## 2024-11-11 MED ORDER — LACTATED RINGERS IV SOLN
500.0000 mL | INTRAVENOUS | Status: DC | PRN
  Administered 2024-11-11 (×2): 500 mL via INTRAVENOUS

## 2024-11-11 MED ORDER — TERBUTALINE SULFATE 1 MG/ML IJ SOLN: 0.2500 mg | Freq: Once | INTRAMUSCULAR | Status: DC | PRN

## 2024-11-11 MED ORDER — SIMETHICONE 80 MG PO CHEW: 80.0000 mg | CHEWABLE_TABLET | ORAL | Status: DC | PRN

## 2024-11-11 MED ORDER — DIPHENHYDRAMINE HCL 25 MG PO CAPS: 25.0000 mg | ORAL_CAPSULE | Freq: Four times a day (QID) | ORAL | Status: DC | PRN

## 2024-11-11 MED ORDER — OXYTOCIN-SODIUM CHLORIDE 30-0.9 UT/500ML-% IV SOLN: 2.5000 [IU]/h | INTRAVENOUS | Status: DC

## 2024-11-11 MED ORDER — SODIUM CHLORIDE 0.9 % IV SOLN
5.0000 10*6.[IU] | Freq: Once | INTRAVENOUS | Status: AC
  Administered 2024-11-11: 5 10*6.[IU] via INTRAVENOUS
  Filled 2024-11-11: qty 5

## 2024-11-11 MED ORDER — ONDANSETRON HCL 4 MG/2ML IJ SOLN: 4.0000 mg | INTRAMUSCULAR | Status: DC | PRN

## 2024-11-11 MED ORDER — DIBUCAINE (PERIANAL) 1 % EX OINT: 1.0000 | TOPICAL_OINTMENT | CUTANEOUS | Status: DC | PRN

## 2024-11-11 MED ORDER — ACETAMINOPHEN 325 MG PO TABS: 650.0000 mg | ORAL_TABLET | ORAL | Status: DC | PRN

## 2024-11-11 MED ORDER — OXYTOCIN-SODIUM CHLORIDE 30-0.9 UT/500ML-% IV SOLN
1.0000 m[IU]/min | INTRAVENOUS | Status: DC
  Administered 2024-11-11: 2 m[IU]/min via INTRAVENOUS
  Filled 2024-11-11: qty 500

## 2024-11-11 MED ORDER — ONDANSETRON HCL 4 MG PO TABS: 4.0000 mg | ORAL_TABLET | ORAL | Status: DC | PRN

## 2024-11-11 MED ORDER — LINACLOTIDE 145 MCG PO CAPS
145.0000 ug | ORAL_CAPSULE | Freq: Every day | ORAL | Status: DC
  Administered 2024-11-12 – 2024-11-13 (×2): 145 ug via ORAL
  Filled 2024-11-11 (×3): qty 1

## 2024-11-11 MED ORDER — TETANUS-DIPHTH-ACELL PERTUSSIS 5-2-15.5 LF-MCG/0.5 IM SUSP: 0.5000 mL | Freq: Once | INTRAMUSCULAR | Status: DC

## 2024-11-11 MED ORDER — ONDANSETRON HCL 4 MG/2ML IJ SOLN: 4.0000 mg | Freq: Four times a day (QID) | INTRAMUSCULAR | Status: DC | PRN

## 2024-11-11 MED ORDER — FENTANYL-BUPIVACAINE-NACL 0.5-0.125-0.9 MG/250ML-% EP SOLN
12.0000 mL/h | EPIDURAL | Status: DC | PRN
  Administered 2024-11-11: 12 mL/h via EPIDURAL
  Filled 2024-11-11: qty 250

## 2024-11-11 MED ORDER — LACTATED RINGERS IV SOLN
500.0000 mL | Freq: Once | INTRAVENOUS | Status: AC
  Administered 2024-11-11: 500 mL via INTRAVENOUS

## 2024-11-11 MED ORDER — OXYCODONE-ACETAMINOPHEN 5-325 MG PO TABS: 1.0000 | ORAL_TABLET | ORAL | Status: DC | PRN

## 2024-11-11 MED ORDER — LACTATED RINGERS IV SOLN: INTRAVENOUS | Status: DC

## 2024-11-11 MED ORDER — ZOLPIDEM TARTRATE 5 MG PO TABS: 5.0000 mg | ORAL_TABLET | Freq: Every evening | ORAL | Status: DC | PRN

## 2024-11-11 MED ORDER — OXYCODONE-ACETAMINOPHEN 5-325 MG PO TABS: 2.0000 | ORAL_TABLET | ORAL | Status: DC | PRN

## 2024-11-11 MED ORDER — EPHEDRINE 5 MG/ML INJ: 10.0000 mg | INTRAVENOUS | Status: DC | PRN

## 2024-11-11 MED ORDER — MIRTAZAPINE 7.5 MG PO TABS
7.5000 mg | ORAL_TABLET | Freq: Every day | ORAL | Status: DC
  Administered 2024-11-11 – 2024-11-12 (×2): 7.5 mg via ORAL
  Filled 2024-11-11 (×3): qty 1

## 2024-11-11 MED ORDER — PHENYLEPHRINE 80 MCG/ML (10ML) SYRINGE FOR IV PUSH (FOR BLOOD PRESSURE SUPPORT): 80.0000 ug | PREFILLED_SYRINGE | INTRAVENOUS | Status: DC | PRN

## 2024-11-11 MED ORDER — BENZOCAINE-MENTHOL 20-0.5 % EX AERO
1.0000 | INHALATION_SPRAY | CUTANEOUS | Status: DC | PRN
  Filled 2024-11-11: qty 56

## 2024-11-11 MED ORDER — RHO D IMMUNE GLOBULIN 1500 UNIT/2ML IJ SOSY
300.0000 ug | PREFILLED_SYRINGE | Freq: Once | INTRAMUSCULAR | Status: AC
  Administered 2024-11-12: 300 ug via INTRAVENOUS
  Filled 2024-11-11: qty 2

## 2024-11-11 MED ORDER — WITCH HAZEL-GLYCERIN EX PADS: 1.0000 | MEDICATED_PAD | CUTANEOUS | Status: DC | PRN

## 2024-11-11 MED ORDER — LIDOCAINE HCL (PF) 1 % IJ SOLN: 30.0000 mL | INTRAMUSCULAR | Status: DC | PRN

## 2024-11-11 MED ORDER — SOD CITRATE-CITRIC ACID 500-334 MG/5ML PO SOLN: 30.0000 mL | ORAL | Status: DC | PRN

## 2024-11-11 MED ORDER — DIPHENHYDRAMINE HCL 50 MG/ML IJ SOLN: 12.5000 mg | INTRAMUSCULAR | Status: DC | PRN

## 2024-11-11 MED ORDER — PENICILLIN G POT IN DEXTROSE 60000 UNIT/ML IV SOLN
3.0000 10*6.[IU] | INTRAVENOUS | Status: DC
  Administered 2024-11-11 (×2): 3 10*6.[IU] via INTRAVENOUS
  Filled 2024-11-11 (×2): qty 50

## 2024-11-11 MED ORDER — SENNOSIDES-DOCUSATE SODIUM 8.6-50 MG PO TABS
2.0000 | ORAL_TABLET | Freq: Every day | ORAL | Status: DC
  Administered 2024-11-12 – 2024-11-13 (×2): 2 via ORAL
  Filled 2024-11-11 (×2): qty 2

## 2024-11-11 MED ORDER — COCONUT OIL OIL: 1.0000 | TOPICAL_OIL | Status: DC | PRN

## 2024-11-11 MED ORDER — PRENATAL MULTIVITAMIN CH
1.0000 | ORAL_TABLET | Freq: Every day | ORAL | Status: DC
  Administered 2024-11-12 – 2024-11-13 (×2): 1 via ORAL
  Filled 2024-11-11 (×2): qty 1

## 2024-11-11 NOTE — Anesthesia Preprocedure Evaluation (Signed)
"                                    Anesthesia Evaluation  Patient identified by MRN, date of birth, ID band Patient awake    Reviewed: Allergy & Precautions, NPO status , Patient's Chart, lab work & pertinent test results  Airway Mallampati: II  TM Distance: >3 FB Neck ROM: Full    Dental  (+) Teeth Intact, Dental Advisory Given   Pulmonary asthma    Pulmonary exam normal breath sounds clear to auscultation       Cardiovascular negative cardio ROS Normal cardiovascular exam Rhythm:Regular Rate:Normal     Neuro/Psych  PSYCHIATRIC DISORDERS Anxiety Depression    negative neurological ROS     GI/Hepatic negative GI ROS, Neg liver ROS,,,  Endo/Other  Obesity   Renal/GU negative Renal ROS     Musculoskeletal negative musculoskeletal ROS (+)    Abdominal   Peds  Hematology  (+) Blood dyscrasia, anemia Plt 194k   Anesthesia Other Findings Day of surgery medications reviewed with the patient.  Reproductive/Obstetrics (+) Pregnancy                              Anesthesia Physical Anesthesia Plan  ASA: 2  Anesthesia Plan: Epidural   Post-op Pain Management:    Induction:   PONV Risk Score and Plan: 2 and Treatment may vary due to age or medical condition  Airway Management Planned: Natural Airway  Additional Equipment:   Intra-op Plan:   Post-operative Plan:   Informed Consent: I have reviewed the patients History and Physical, chart, labs and discussed the procedure including the risks, benefits and alternatives for the proposed anesthesia with the patient or authorized representative who has indicated his/her understanding and acceptance.     Dental advisory given  Plan Discussed with:   Anesthesia Plan Comments: (Patient identified. Risks/Benefits/Options discussed with patient including but not limited to bleeding, infection, nerve damage, paralysis, failed block, incomplete pain control, headache, blood  pressure changes, nausea, vomiting, reactions to medication both or allergic, itching and postpartum back pain. Confirmed with bedside nurse the patient's most recent platelet count. Confirmed with patient that they are not currently taking any anticoagulation, have any bleeding history or any family history of bleeding disorders. Patient expressed understanding and wished to proceed. All questions were answered. )        Anesthesia Quick Evaluation  "

## 2024-11-11 NOTE — MAU Note (Signed)
 Pt says she was here Monday  6/10 night for UC'S- VE- 1 cm.      UC's stronger - Now 6/10  more at night - UC last longer . Feels baby moving. PNC- [facility]- was seen last Thursday  Denies HSV GBS- neg

## 2024-11-11 NOTE — Progress Notes (Signed)
 S: Comfortable with epidural. Consented for AROM.   O: Vitals:   11/11/24 1113 11/11/24 1116 11/11/24 1126 11/11/24 1201  BP: 120/73 106/65 103/61 (!) 89/70  Pulse: 79 74 77 75  Resp:      Temp:      TempSrc:      SpO2: 100% 100% 100%   Weight:      Height:       FHT:  FHR: 140 bpm, variability: moderate,  accelerations:  Present,  decelerations:  Absent UC:   regular, every 2-4 minutes SVE:   Dilation: 3 Effacement (%): 90 Station: -2 Exam by:: Avelynn Sellin,CNM  AROM of a moderate amount of clear fluid at 1155.   A / P: Protracted latent phase, Pitocin  for augmentation, AROM now  Fetal Wellbeing:  Category I GBS: Positive, PCN ongoing Pain Control:  Epidural Anticipated MOD:  NSVD  Continue Pitocin  2x2.   Alan MARLA Molt, CNM, MSN 11/11/2024, 12:27 PM

## 2024-11-11 NOTE — H&P (Signed)
 Mariah Crawford is a 19 y.o. female presenting for contractions.   I was callled by RN to review the strip.   . OB History     Gravida  1   Para      Term      Preterm      AB      Living         SAB      IAB      Ectopic      Multiple      Live Births             Past Medical History:  Diagnosis Date   Asthma    Depression    Past Surgical History:  Procedure Laterality Date   ADENOIDECTOMY W/ MYRINGOTOMY     TONSILLECTOMY     TYMPANOSTOMY TUBE PLACEMENT Bilateral    Family History: family history includes Healthy in her father and mother. Social History:  reports that she has never smoked. She has been exposed to tobacco smoke. She has never used smokeless tobacco. She reports that she does not currently use alcohol after a past usage of about 2.0 standard drinks of alcohol per week. She reports current drug use.     Maternal Diabetes: No Maternal Ultrasounds/Referrals: Normal Fetal Ultrasounds or other Referrals:  None Maternal Substance Abuse:  No Significant Maternal Medications:  None Significant Maternal Lab Results:  Group B Strep positive Number of Prenatal Visits:greater than 3 verified prenatal visits Maternal Vaccinations declined Other Comments:  None  Review of Systems History Dilation: 1.5 Effacement (%): 70 Station: -2 Exam by:: [place], RN Blood pressure 105/71, pulse 86, temperature 98.5 F (36.9 C), temperature source Oral, resp. rate 12, height 5' 4 (1.626 m), weight 86.4 kg. Exam Physical Exam  Physical Examination:   Prenatal labs: ABO, Rh:   Antibody:   Rubella:   RPR:    HBsAg:    HIV:    GBS:    pos Assessment/Plan: IUP pat due Cat 2 strip.  Variabilty is reassuring.  Will give IV bouls and position change to see if fetal tracing improves The variabilty is reassuring and pt does not have a vrible or late with each contraction If strips improves would recommend IOL with low dose pitocin  IF not , would proceed  with CS Monitor very closely  RH neg may need rhophylac  PP   Lyan Holck A Jarrette Dehner 11/11/2024, 4:54 AM

## 2024-11-11 NOTE — Anesthesia Procedure Notes (Signed)
 Epidural Patient location during procedure: OB Start time: 11/11/2024 10:48 AM End time: 11/11/2024 10:54 AM  Staffing Anesthesiologist: Corinne Garnette BRAVO, MD Performed: anesthesiologist   Preanesthetic Checklist Completed: patient identified, IV checked, risks and benefits discussed, monitors and equipment checked, pre-op evaluation and timeout performed  Epidural Patient position: sitting Prep: DuraPrep Patient monitoring: blood pressure and continuous pulse ox Approach: midline Location: L3-L4 Injection technique: LOR air  Needle:  Needle type: Tuohy  Needle gauge: 17 G Needle length: 9 cm Needle insertion depth: 6 cm Catheter size: 19 Gauge Catheter at skin depth: 11 cm Test dose: negative and Other (1% Lidocaine )  Additional Notes Patient identified.  Risk benefits discussed including failed block, incomplete pain control, headache, nerve damage, paralysis, blood pressure changes, nausea, vomiting, reactions to medication both toxic or allergic, and postpartum back pain.  Patient expressed understanding and wished to proceed.  All questions were answered.  Sterile technique used throughout procedure and epidural site dressed with sterile barrier dressing. No paresthesia or other complications noted. The patient did not experience any signs of intravascular injection such as tinnitus or metallic taste in mouth nor signs of intrathecal spread such as rapid motor block. Please see nursing notes for vital signs. Reason for block:procedure for pain

## 2024-11-12 DIAGNOSIS — D62 Acute posthemorrhagic anemia: Secondary | ICD-10-CM | POA: Diagnosis not present

## 2024-11-12 DIAGNOSIS — K589 Irritable bowel syndrome without diarrhea: Secondary | ICD-10-CM | POA: Diagnosis present

## 2024-11-12 LAB — CBC
HCT: 24.2 % — ABNORMAL LOW (ref 36.0–46.0)
Hemoglobin: 7.8 g/dL — ABNORMAL LOW (ref 12.0–15.0)
MCH: 24.9 pg — ABNORMAL LOW (ref 26.0–34.0)
MCHC: 32.2 g/dL (ref 30.0–36.0)
MCV: 77.3 fL — ABNORMAL LOW (ref 80.0–100.0)
Platelets: 152 10*3/uL (ref 150–400)
RBC: 3.13 MIL/uL — ABNORMAL LOW (ref 3.87–5.11)
RDW: 15.3 % (ref 11.5–15.5)
WBC: 14.5 10*3/uL — ABNORMAL HIGH (ref 4.0–10.5)
nRBC: 0 % (ref 0.0–0.2)

## 2024-11-12 LAB — HEPATITIS B SURFACE ANTIGEN: Hepatitis B Surface Ag: NONREACTIVE

## 2024-11-12 MED ORDER — SODIUM CHLORIDE 0.9 % IV SOLN
500.0000 mg | Freq: Once | INTRAVENOUS | Status: AC
  Administered 2024-11-12: 500 mg via INTRAVENOUS
  Filled 2024-11-12 (×2): qty 25

## 2024-11-12 NOTE — Social Work (Addendum)
 MOB was referred for history of depression/anxiety.  * Referral screened out by Clinical Social Worker because none of the following criteria appear to apply:  ~ History of anxiety/depression during this pregnancy, or of post-partum depression following prior delivery.  ~ Diagnosis of anxiety and/or depression within last 3 years OR * MOB's symptoms currently being treated with medication and/or therapy. Per OB records MOB has an active prescription for Effexor  and Remeron , followed by psychiatrist, mood stable. Edinburgh=1  Please contact the Clinical Social Worker if needs arise, or by MOB request.   Eliazar Gave, LCSWA Clinical Social Worker [phone]

## 2024-11-12 NOTE — Lactation Note (Signed)
 This note was copied from a baby's chart. Lactation Consultation Note  Patient Name: Mariah Crawford Unijb'd Date: 11/12/2024 Age:21 hours Reason for consult: Follow-up assessment;Mother's request;Primapara;Term  P1. Mom called for LC to see latch. LC got mom to flange top and bottom lip. Mom stated she could feel the difference. Denies painful latches. Praised mom. Heard swallows, could see baby swallowing. Praised mom.  Mom asked if she should give the formula after BF. LC suggested mom feel her breast before and after feeding to see if breast is softer so she will know if the baby got something. LC suggested that if baby is sleeping and arm is floppy and relaxed the baby probably had plenty and doesn't need the formula.  Mom stated OK.  Maternal Data Has patient been taught Hand Expression?: Yes Does the patient have breastfeeding experience prior to this delivery?: No  Feeding Mother's Current Feeding Choice: Breast Milk and Formula Nipple Type: Slow - flow  LATCH Score Latch: Grasps breast easily, tongue down, lips flanged, rhythmical sucking.  Audible Swallowing: A few with stimulation  Type of Nipple: Everted at rest and after stimulation  Comfort (Breast/Nipple): Soft / non-tender  Hold (Positioning): No assistance needed to correctly position infant at breast.  LATCH Score: 9   Lactation Tools Discussed/Used    Interventions Interventions: Breast feeding basics reviewed;Skin to skin;Hand express;Breast compression;Support pillows;Education  Discharge Discharge Education: Outpatient recommendation Pump: DEBP;Hands Free;Manual (Debp-Motif, Hand pump-Medela, Hands free-Mom Cozy)  Consult Status Consult Status: Follow-up Date: 11/13/24 Follow-up type: In-patient    [name] 11/12/2024, 6:13 AM

## 2024-11-12 NOTE — Lactation Note (Signed)
 This note was copied from a baby's chart. Lactation Consultation Note  Patient Name: Boy Willamae Demby Unijb'd Date: 11/12/2024 Age:21 hours Reason for consult: Initial assessment;Primapara;Term  P1. Mom awake. Mom stated BF is going well. Mom is doing BF/formula feeding. Asked mom to call for LC to see latch tonight. Mom encouraged to feed baby 8-12 times/24 hours and with feeding cues.  Newborn feeding habits, behavior, STS, I&O, reviewed. Call for assistance or questions as needed. Maternal Data Has patient been taught Hand Expression?: Yes Does the patient have breastfeeding experience prior to this delivery?: No  Feeding Nipple Type: Slow - flow  LATCH Score       Type of Nipple: Everted at rest and after stimulation  Comfort (Breast/Nipple): Soft / non-tender         Lactation Tools Discussed/Used    Interventions Interventions: Breast feeding basics reviewed;Hand express;Education;LC Services brochure  Discharge Discharge Education: Outpatient recommendation Pump: DEBP;Hands Free;Manual (Debp-Motif, Hand pump-Medela, Hands free-Mom Cozy)  Consult Status Consult Status: Follow-up Date: 11/12/24 Follow-up type: In-patient    Myreon Wimer G 11/12/2024, 2:25 AM

## 2024-11-12 NOTE — Anesthesia Postprocedure Evaluation (Signed)
"   Anesthesia Post Note  Patient: Mariah Crawford  Procedure(s) Performed: AN AD HOC LABOR EPIDURAL     Patient location during evaluation: Mother Baby Anesthesia Type: Epidural Level of consciousness: awake and alert Pain management: pain level controlled Vital Signs Assessment: post-procedure vital signs reviewed and stable Respiratory status: spontaneous breathing, nonlabored ventilation and respiratory function stable Cardiovascular status: stable Postop Assessment: no headache, no backache and epidural receding Anesthetic complications: no   No notable events documented.  Last Vitals:  Vitals:   11/12/24 0815 11/12/24 2059  BP: (!) 95/54 115/79  Pulse: 90 73  Resp: 18 18  Temp: 36.6 C 36.8 C  SpO2: 100% 100%    Last Pain:  Vitals:   11/12/24 2100  TempSrc:   PainSc: 2    Pain Goal:                   Harvey Erna Jansky      "

## 2024-11-12 NOTE — Lactation Note (Signed)
 This note was copied from a baby's chart. Lactation Consultation Note  Patient Name: Mariah Crawford Unijb'd Date: 11/12/2024 Age:21 hours Reason for consult: 1st time breastfeeding;Follow-up assessment;Term.  P1, MOB is exclusively breastfeeding infant today, this is infant 4th latch since 0600 am today. MOB latched infant on her right breast using the cross cradle hold with pillow support, infant sustained his latch and was still breastfeeding after 11 minutes when LC left the room. MOB will continue to breastfeed infant by cues, on demand, 8-12 times within 24 hours, skin to skin. MOB knows to call for latch assistance if needed. LC discussed the importance of maternal rest, meals and hydration. MOB knows that infant may start cluster feeding after 24 hours of life and that this is normal newborn behavior.    Maternal Data    Feeding Mother's Current Feeding Choice: Breast Milk  LATCH Score Latch: Grasps breast easily, tongue down, lips flanged, rhythmical sucking.  Audible Swallowing: Spontaneous and intermittent  Type of Nipple: Everted at rest and after stimulation  Comfort (Breast/Nipple): Soft / non-tender  Hold (Positioning): Assistance needed to correctly position infant at breast and maintain latch.  LATCH Score: 9   Lactation Tools Discussed/Used    Interventions Interventions: Skin to skin;Assisted with latch;Breast compression;Adjust position;Support pillows;Position options;Education  Discharge    Consult Status Consult Status: Follow-up Date: 11/13/24 Follow-up type: In-patient    [name] 11/12/2024, 1:56 PM

## 2024-11-12 NOTE — Progress Notes (Signed)
 "   Subjective: Postpartum Day # 1 : S/P NSVD due to pt was admitted on 2/4 for IOL for Cat 2 strip, progressed with AROM and pitocin , GBS+ x3 doses PCN to svd on 2/4 over 2nd degree, ,ebl , hgb drop of 10-7.8 asymptomatic, but desires IV Iron  transfusion. H?O IBS continue linezz, h/o MDD/GAD continue mirtazapine  and Effexor , mood stable, RH-, received rhogam 2/5, Hep B -. Had baby female and desires in pt circ. . Patient up ad lib, denies syncope or dizziness. Reports consuming regular diet without issues and denies N/V. Patient reports 0 bowel movement + passing flatus.  Denies issues with urination and reports bleeding is lighter.  Patient is breastfeeding and reports going well.  Desires pills for postpartum contraception.  Pain is being appropriately managed with use of po meds. Mood stable, discussed increase in effexor  pt denies and endorses mood is stable, denies SI   2nd laceration Feeding:  br Contraceptive plan:  pills BB: Circ in pt desires tomorrow prior to discharge  Objective: Vital signs in last 24 hours: Patient Vitals for the past 24 hrs:  BP Temp Temp src Pulse Resp SpO2  11/12/24 0815 (!) 95/54 97.8 F (36.6 C) Oral 90 18 100 %  11/12/24 0309 104/83 98.5 F (36.9 C) Oral 81 18 99 %  11/11/24 2330 98/62 98.4 F (36.9 C) Oral 72 17 99 %  11/11/24 1940 105/73 99 F (37.2 C) Oral 73 18 99 %  11/11/24 1836 115/71 98.2 F (36.8 C) Oral 80 16 100 %  11/11/24 1731 107/65 -- -- (!) 103 -- --  11/11/24 1714 108/68 -- -- 87 -- --  11/11/24 1640 (!) 118/106 (!) 97.4 F (36.3 C) Oral (!) 183 -- --  11/11/24 1633 (!) 180/146 -- -- 78 -- --  11/11/24 1602 (!) 99/53 -- -- 86 -- --  11/11/24 1531 106/78 -- -- 76 -- --  11/11/24 1501 (!) 91/53 -- -- 73 -- --  11/11/24 1431 (!) 89/58 -- -- 83 -- --  11/11/24 1406 (!) 85/46 97.8 F (36.6 C) Oral 83 16 --  11/11/24 1402 (!) 115/95 -- -- (!) 105 -- --  11/11/24 1331 (!) 103/58 -- -- 80 -- --  11/11/24 1301 (!) 92/51 -- -- 74  -- --  11/11/24 1231 (!) 86/45 -- -- 67 -- --  11/11/24 1201 (!) 89/70 -- -- 75 -- --  11/11/24 1126 103/61 -- -- 77 -- 100 %  11/11/24 1116 106/65 -- -- 74 -- 100 %  11/11/24 1113 120/73 -- -- 79 -- 100 %  11/11/24 1106 104/66 -- -- 74 -- 100 %  11/11/24 1102 109/64 -- -- 74 -- 100 %  11/11/24 1056 112/70 -- -- 76 16 --     Physical Exam:  General: alert, cooperative, and appears stated age Mood/Affect: happy Lungs: clear to auscultation, no wheezes, rales or rhonchi, symmetric air entry.  Heart: normal rate, regular rhythm, normal S1, S2, no murmurs, rubs, clicks or gallops. Breast: breasts appear normal, no suspicious masses, no skin or nipple changes or axillary nodes. Abdomen:  + bowel sounds, soft, non-tender GU: perineum approximate, healing well. No signs of external hematomas.  Uterine Fundus: firm Lochia: appropriate Skin: Warm, Dry. DVT Evaluation: No evidence of DVT seen on physical exam. Negative Homan's sign. No cords or calf tenderness. No significant calf/ankle edema.     Latest Ref Rng & Units 11/12/2024    4:46 AM 11/11/2024    4:47 AM 03/20/2023  8:47 AM  CBC  WBC 4.0 - 10.5 K/uL 14.5  10.1  9.2   Hemoglobin 12.0 - 15.0 g/dL 7.8  89.9  88.1   Hematocrit 36.0 - 46.0 % 24.2  32.2  37.0   Platelets 150 - 400 K/uL 152  194  272     Results for orders placed or performed during the hospital encounter of 11/11/24 (from the past 24 hours)  CBC     Status: Abnormal   Collection Time: 11/12/24  4:46 AM  Result Value Ref Range   WBC 14.5 (H) 4.0 - 10.5 K/uL   RBC 3.13 (L) 3.87 - 5.11 MIL/uL   Hemoglobin 7.8 (L) 12.0 - 15.0 g/dL   HCT 75.7 (L) 63.9 - 53.9 %   MCV 77.3 (L) 80.0 - 100.0 fL   MCH 24.9 (L) 26.0 - 34.0 pg   MCHC 32.2 30.0 - 36.0 g/dL   RDW 84.6 88.4 - 84.4 %   Platelets 152 150 - 400 K/uL   nRBC 0.0 0.0 - 0.2 %  .Hepatitis B Surface Antigen     Status: None   Collection Time: 11/12/24  4:46 AM  Result Value Ref Range   Hepatitis B Surface Ag  NON REACTIVE NON REACTIVE  Rh IG workup (includes ABO/Rh)     Status: None (Preliminary result)   Collection Time: 11/12/24  4:46 AM  Result Value Ref Range   Gestational Age(Wks) 40    Fetal Screen NEG    Unit Number [id number]    Blood Component Type RHIG    Unit division 00    Status of Unit ISSUED    Transfusion Status      OK TO TRANSFUSE Performed at [facility], [address], [place], [place] [address]      CBG (last 3)  No results for input(s): GLUCAP in the last 72 hours.   I/O last 3 completed shifts: In: 164.4 [I.V.:85.6; Other:78.8] Out: 1417 [Urine:1100; Blood:317]   Assessment Postpartum Day # 1 : S/P NSVD due to pt was admitted on 2/4 for IOL for Cat 2 strip, progressed with AROM and pitocin , GBS+ x3 doses PCN to svd on 2/4 over 2nd degree, ,ebl , hgb drop of 10-7.8 asymptomatic, but desires IV Iron  transfusion. H?O IBS continue linezz, h/o MDD/GAD continue mirtazapine  and Effexor , mood stable, RH-, received rhogam 2/5, Hep B -. Had baby female and desires in pt circ.Pt stable. -1 involution. BRfeeding. Hemodynamically stable.   Plan: Continue other mgmt as ordered VTE prophylactics: Early ambulated as tolerates.  Pain control: Motrin /Tylenol  PRN IBS: continue linezz H/O MDD/GAD: continue effexor  75mg  and mirtazapine  7.5mg  at night ABLA: Ive iron  transfusion today.  Education given regarding options for contraception, including barrier methods, injectable contraception, IUD placement, oral contraceptives.  Plan for discharge tomorrow, Breastfeeding, Lactation consult, Circumcision prior to discharge, and Contraception PO Pills  Dr. Kandis to be updated on patient status  Mariah Crawford  CNM, FNP-C, PMHNP-BC  [address]  [place], [place] [address]  Cell: [phone]  Office Phone: [phone] Fax: [phone] 11/12/2024  10:55 AM    "

## 2024-11-13 ENCOUNTER — Other Ambulatory Visit (HOSPITAL_COMMUNITY): Payer: Self-pay

## 2024-11-13 LAB — RH IG WORKUP (INCLUDES ABO/RH)
Fetal Screen: NEGATIVE
Gestational Age(Wks): 40
Unit division: 0

## 2024-11-13 LAB — BIRTH TISSUE RECOVERY COLLECTION (PLACENTA DONATION)

## 2024-11-13 MED ORDER — LINACLOTIDE 145 MCG PO CAPS
145.0000 ug | ORAL_CAPSULE | Freq: Every day | ORAL | 1 refills | Status: AC
  Filled 2024-11-13: qty 30, 30d supply, fill #0

## 2024-11-13 MED ORDER — POLYSACCHARIDE IRON COMPLEX 150 MG PO CAPS
150.0000 mg | ORAL_CAPSULE | Freq: Every day | ORAL | 1 refills | Status: AC
  Filled 2024-11-13: qty 30, 30d supply, fill #0

## 2024-11-13 MED ORDER — ACETAMINOPHEN 325 MG PO TABS: 1000.0000 mg | ORAL_TABLET | Freq: Four times a day (QID) | ORAL | Status: AC

## 2024-11-13 MED ORDER — POLYSACCHARIDE IRON COMPLEX 150 MG PO CAPS
150.0000 mg | ORAL_CAPSULE | Freq: Every day | ORAL | Status: DC
  Administered 2024-11-13: 150 mg via ORAL
  Filled 2024-11-13: qty 1

## 2024-11-13 MED ORDER — IBUPROFEN 600 MG PO TABS
600.0000 mg | ORAL_TABLET | Freq: Four times a day (QID) | ORAL | 0 refills | Status: AC
  Filled 2024-11-13: qty 30, 8d supply, fill #0

## 2024-11-13 NOTE — Discharge Summary (Cosign Needed)
 "    Postpartum Discharge Summary  Date of Service updated 11/13/24    Patient Name: Mariah Crawford DOB: 06-23-2006 MRN: [id number]  Date of admission: 11/11/2024 Delivery date:11/11/2024 Delivering provider: [place] , JADE Date of discharge: 11/13/2024  Admitting diagnosis: Post-dates pregnancy [O48.0] Intrauterine pregnancy: [redacted]w[redacted]d     Secondary diagnosis:  Principal Problem:   Post-dates pregnancy Active Problems:   SVD (spontaneous vaginal delivery)   Normal postpartum course   IBS (irritable bowel syndrome)   ABLA (acute blood loss anemia)  Additional problems: none    Discharge diagnosis: Term Pregnancy Delivered                                              Post partum procedures:none Augmentation: AROM and Pitocin  Complications: None  Hospital course: Onset of Labor With Vaginal Delivery      19 y.o. yo G1P1001 at [redacted]w[redacted]d presented for a labor evaluations and was noted to be 1.5 cm. A category 2 fetal surveillance was the indication for admission and induction of labor. Labor course was uncomplicated  Membrane Rupture Time/Date: 11:55 AM,11/11/2024  Delivery Method:Vaginal, Spontaneous Operative Delivery:N/A Episiotomy: None Lacerations:  2nd degree;Perineal Patient had an uncomplicated postpartum course in the presence of asymptomatic anemia. She was on PO iron  prior to admission, declined IV iron , and agreed to resume PO iron  and an iron -rich diet.  She is ambulating, tolerating a regular diet, passing flatus, and urinating well. Patient is discharged home in stable condition on 11/13/24.  Newborn Data: Birth date:11/11/2024 Birth time:5:04 PM Gender:Female Living status:Living Apgars:9 ,9  Weight:3300 g  Magnesium Sulfate received: No BMZ received: No Rhophylac :N/A MMR:N/A T-DaP:declined Flu: No RSV Vaccine received: No Transfusion:No Immunizations administered: There is no immunization history for the selected administration types on file for this patient.  Physical  exam  Vitals:   11/12/24 0309 11/12/24 0815 11/12/24 2059 11/13/24 0545  BP: 104/83 (!) 95/54 115/79 123/74  Pulse: 81 90 73 75  Resp: 18 18 18 17   Temp: 98.5 F (36.9 C) 97.8 F (36.6 C) 98.2 F (36.8 C) 97.7 F (36.5 C)  TempSrc: Oral Oral Oral Oral  SpO2: 99% 100% 100% 100%  Weight:      Height:       General: alert, cooperative, and no distress Lochia: appropriate Uterine Fundus: firm Incision: N/A DVT Evaluation: No evidence of DVT seen on physical exam. No cords or calf tenderness. No significant calf/ankle edema. Labs: Lab Results  Component Value Date   WBC 14.5 (H) 11/12/2024   HGB 7.8 (L) 11/12/2024   HCT 24.2 (L) 11/12/2024   MCV 77.3 (L) 11/12/2024   PLT 152 11/12/2024      Latest Ref Rng & Units 03/20/2023    8:47 AM  CMP  Glucose 70 - 99 mg/dL 90   BUN 6 - 20 mg/dL 10   Creatinine 9.55 - 1.00 mg/dL 9.11   Sodium 864 - 854 mmol/L 138   Potassium 3.5 - 5.1 mmol/L 3.7   Chloride 98 - 111 mmol/L 101   CO2 22 - 32 mmol/L 21   Calcium 8.9 - 10.3 mg/dL 9.3   Total Protein 6.5 - 8.1 g/dL 7.3   Total Bilirubin 0.3 - 1.2 mg/dL 0.7   Alkaline Phos 38 - 126 U/L 66   AST 15 - 41 U/L 25   ALT 0 - 44 U/L 13  Edinburgh Score:    11/11/2024    7:46 PM  Edinburgh Postnatal Depression Scale Screening Tool  I have been able to laugh and see the funny side of things. 0  I have looked forward with enjoyment to things. 0  I have blamed myself unnecessarily when things went wrong. 0  I have been anxious or worried for no good reason. 0  I have felt scared or panicky for no good reason. 0  Things have been getting on top of me. 1  I have been so unhappy that I have had difficulty sleeping. 0  I have felt sad or miserable. 0  I have been so unhappy that I have been crying. 0  The thought of harming myself has occurred to me. 0  Edinburgh Postnatal Depression Scale Total 1      After visit meds:  Allergies as of 11/13/2024       Reactions   Lactose  Intolerance (gi)         Medication List     STOP taking these medications    ferrous sulfate 324 MG Tbec   FLUoxetine  10 MG capsule Commonly known as: PROZAC    hydrOXYzine  50 MG tablet Commonly known as: ATARAX    metroNIDAZOLE  500 MG tablet Commonly known as: FLAGYL    mirtazapine  7.5 MG tablet Commonly known as: REMERON    nicotine  7 mg/24hr patch Commonly known as: NICODERM CQ  - dosed in mg/24 hr       TAKE these medications    acetaminophen  325 MG tablet Commonly known as: Tylenol  Take 3 tablets (975 mg total) by mouth every 6 (six) hours.   ibuprofen  600 MG tablet Commonly known as: ADVIL  Take 1 tablet (600 mg total) by mouth every 6 (six) hours.   iron  polysaccharides 150 MG capsule Commonly known as: NIFEREX Take 1 capsule (150 mg total) by mouth daily.   linaclotide  145 MCG Caps capsule Commonly known as: LINZESS  Take 1 capsule (145 mcg total) by mouth daily before breakfast. What changed: Another medication with the same name was added. Make sure you understand how and when to take each.   linaclotide  145 MCG Caps capsule Commonly known as: LINZESS  Take 1 capsule (145 mcg total) by mouth daily before breakfast. Start taking on: November 14, 2024 What changed: You were already taking a medication with the same name, and this prescription was added. Make sure you understand how and when to take each.   prenatal multivitamin Tabs tablet Take 1 tablet by mouth daily at 12 noon.   traZODone  50 MG tablet Commonly known as: DESYREL  Take 1 tablet (50 mg total) by mouth at bedtime as needed for sleep.   venlafaxine  XR 75 MG 24 hr capsule Commonly known as: EFFEXOR -XR Take 75 mg by mouth daily with breakfast.         Discharge home in stable condition Infant Feeding: Breast Infant Disposition:home with mother Discharge instruction: per After Visit Summary and Postpartum booklet. Activity: Advance as tolerated. Pelvic rest for 6 weeks.  Diet:  iron  rich diet Anticipated Birth Control: OCPs Postpartum Appointment:6 weeks Additional Postpartum F/U: Postpartum Depression checkup Future Appointments:No future appointments. Follow up Visit:  Follow-up Information     [facility]. Schedule an appointment as soon as possible for a visit in 6 week(s).   Specialty: Obstetrics and Gynecology Contact information: [address] [place]  [address] [phone]  11/13/2024 Sherese Heyward B Lynnox Girten, CNM   "

## 2024-11-13 NOTE — Lactation Note (Signed)
 This note was copied from a baby's chart. Lactation Consultation Note  Patient Name: Mariah Crawford Unijb'd Date: 11/13/2024 Age:21 hours Reason for consult: Follow-up assessment (attempted to see mom and per family member is in the shower. will attempt to F/U)   Maternal Data    Feeding Mother's Current Feeding Choice: Breast Milk and Formula Nipple Type: Slow - flow   Consult Status Consult Status: Follow-up Date: 11/13/24 Follow-up type: In-patient    [name] [name] 11/13/2024, 10:43 AM

## 2024-11-13 NOTE — Lactation Note (Signed)
 This note was copied from a baby's chart. Lactation Consultation Note  Patient Name: Mariah Crawford Date: 11/13/2024 Age:21 hours, P1  Reason for consult: Follow-up assessment;Primapara;1st time breastfeeding;Term;Infant weight loss (4 % weight loss) Per mom the baby still needs to be circ'd Per mom started formula because the baby didn't seem satisfied after breast feeding one breast.  LC recommended allowing the baby to breast feed on the 1st breast for 15 -20 mins and offer the 2nd breast. If satisfied, hold the formula and offer the breast the next feeding. LC reviewed supply and demand and  the importance of giving the baby practice at the breast.  LC reviewed engorgement prevention and tx.  Per mom has a hand pump, DEBP and hands free at home.  LC reviewed the doc flow sheets and updated per parents, WNL for age. LC reassured parents baby is doing well for his age.  Maternal Data Has patient been taught Hand Expression?: Yes Does the patient have breastfeeding experience prior to this delivery?: No  Feeding Mother's Current Feeding Choice: Breast Milk and Formula  LATCH Score - 9's  Lactation Tools Discussed/Used Tools: Other (comment) (per mom has hand pump)  Interventions Interventions: Breast feeding basics reviewed;Education;LC Services brochure;CDC milk storage guidelines;CDC Guidelines for Breast Pump Cleaning  Discharge Discharge Education: Engorgement and breast care;Warning signs for feeding baby;Outpatient recommendation;Other (comment) (if needed and the [facility] at the [facility]) Pump: Hands Free;Personal;DEBP;Manual  Consult Status Consult Status: Complete Date: 11/13/24 Follow-up type: In-patient    [name] Kashmere Staffa 11/13/2024, 1:05 PM
